# Patient Record
Sex: Female | Born: 1985 | Race: White | Hispanic: No | Marital: Married | Smoking: Never smoker
Health system: Southern US, Community
[De-identification: ages and names within clinical notes are randomized; demographics above are authoritative.]

---

## 2016-12-08 ENCOUNTER — Ambulatory Visit (INDEPENDENT_AMBULATORY_CARE_PROVIDER_SITE_OTHER): Payer: Managed Care, Other (non HMO)

## 2016-12-08 ENCOUNTER — Ambulatory Visit
Admission: EM | Admit: 2016-12-08 | Discharge: 2016-12-08 | Disposition: A | Discharge status: Home / Self Care | Payer: Managed Care, Other (non HMO) | Attending: Family Medicine | Admitting: Family Medicine

## 2016-12-08 DIAGNOSIS — S93492A Sprain of other ligament of left ankle, initial encounter: Secondary | ICD-10-CM

## 2016-12-08 MED ORDER — NAPROXEN 500 MG PO TABS: 500.0000 mg | ORAL_TABLET | Freq: Two times a day (BID) | ORAL | 0 refills | Status: AC

## 2016-12-08 NOTE — ED Provider Notes (Signed)
CSN: [id number]     Arrival date & time 12/08/16  1108 History   First MD Initiated Contact with Patient 12/08/16 1149     Chief Complaint  Patient presents with  . Ankle Pain    left   (Consider location/radiation/quality/duration/timing/severity/associated sxs/prior Treatment) HPI  31 year old female who presents with swelling of her left lateral ankle. No trauma that she remembers is not create increase her activities although they are visiting here from [place]. She states that she does have a mild limp with ambulation. He has no pain distally        History reviewed. No pertinent past medical history. History reviewed. No pertinent surgical history. Family History  Problem Relation Age of Onset  . Hypertension Mother   . Thyroid disease Mother   . Diabetes Father    Social History  Substance Use Topics  . Smoking status: Never Smoker  . Smokeless tobacco: Never Used  . Alcohol use Not on file   OB History    No data available     Review of Systems  Constitutional: Positive for activity change. Negative for chills, fatigue and fever.  Musculoskeletal: Positive for joint swelling.  All other systems reviewed and are negative.   Allergies  Patient has no known allergies.  Home Medications   Prior to Admission medications   Medication Sig Start Date End Date Taking? Authorizing Provider  naproxen (NAPROSYN) 500 MG tablet Take 1 tablet (500 mg total) by mouth 2 (two) times daily with a meal. 12/08/16   Lutricia Feil, PA-C   Meds Ordered and Administered this Visit  Medications - No data to display  BP 119/74 (BP Location: Left Arm)   Pulse 76   Temp 98 F (36.7 C) (Oral)   Resp 18   Ht 5\' 8"  (1.727 m)   Wt 167 lb (75.8 kg)   LMP 12/02/2016   SpO2 100%   BMI 25.39 kg/m  No data found.   Physical Exam  Constitutional: She is oriented to person, place, and time. She appears well-developed and well-nourished. No distress.  HENT:  Head:  Normocephalic.  Eyes: Pupils are equal, round, and reactive to light.  Neck: Normal range of motion.  Musculoskeletal: Normal range of motion. She exhibits edema and tenderness.  Examination of the left ankle shows swelling inferior and slightly medial to the fibula.  Tenderness is sharply localized over the anterior O medial distal fibular tip and over the anterior fibulotalar ligament. There is bogginess present. There is no crepitus no induration. There are no breaks of the skin. There is no erythema or warmth present.  Neurological: She is alert and oriented to person, place, and time.  Skin: Skin is warm and dry. She is not diaphoretic.  Psychiatric: She has a normal mood and affect. Her behavior is normal. Judgment and thought content normal.  Nursing note and vitals reviewed.   Urgent Care Course     Procedures (including critical care time)  Labs Review Labs Reviewed - No data to display  Imaging Review Dg Ankle Complete Left  Result Date: 12/08/2016 CLINICAL DATA:  Patient with left ankle swelling. No traumatic injury. Initial encounter. EXAM: LEFT ANKLE COMPLETE - 3+ VIEW COMPARISON:  None. FINDINGS: Mild soft tissue swelling about the lateral malleolus. Normal anatomic alignment. No evidence for acute fracture or dislocation. Talar dome intact. IMPRESSION: No acute osseous abnormality. Soft tissue swelling about the lateral malleoli/ankle. Electronically Signed   By: Annia Belt M.D.   On: 12/08/2016 12:17  Visual Acuity Review  Right Eye Distance:   Left Eye Distance:   Bilateral Distance:    Right Eye Near:   Left Eye Near:    Bilateral Near:         MDM   1. Sprain of anterior talofibular ligament of left ankle, initial encounter    New Prescriptions   NAPROXEN (NAPROSYN) 500 MG TABLET    Take 1 tablet (500 mg total) by mouth 2 (two) times daily with a meal.  Plan: 1. Test/x-ray results and diagnosis reviewed with patient 2. rx as per orders; risks,  benefits, potential side effects reviewed with patient 3. Recommend supportive treatment with Symptom avoidance and rest. Use ankle stirrup brace as needed for comfort. Ice 20 minutes out of every 2 hours 4 times daily as well as elevation above the heart 3 times daily for 30 minutes at a time to help control swelling. Advance activities as tolerated. If not improving [place].   Lutricia FeilRoemer, Corrina Steffensen P, PA-C 12/08/16 1237

## 2016-12-08 NOTE — ED Triage Notes (Addendum)
Pt presents with a swollen left ankle. She denies any trauma and says she noticed it about 2 days ago. Not painful to walk on it.

## 2018-05-13 IMAGING — CR DG ANKLE COMPLETE 3+V*L*
3 series · 3 of 3 positions shown · non-contrast
Comparison: None.

CLINICAL DATA: Patient with left ankle swelling. No traumatic
injury. Initial encounter.

EXAM:
LEFT ANKLE COMPLETE - 3+ VIEW

[ankle ap]
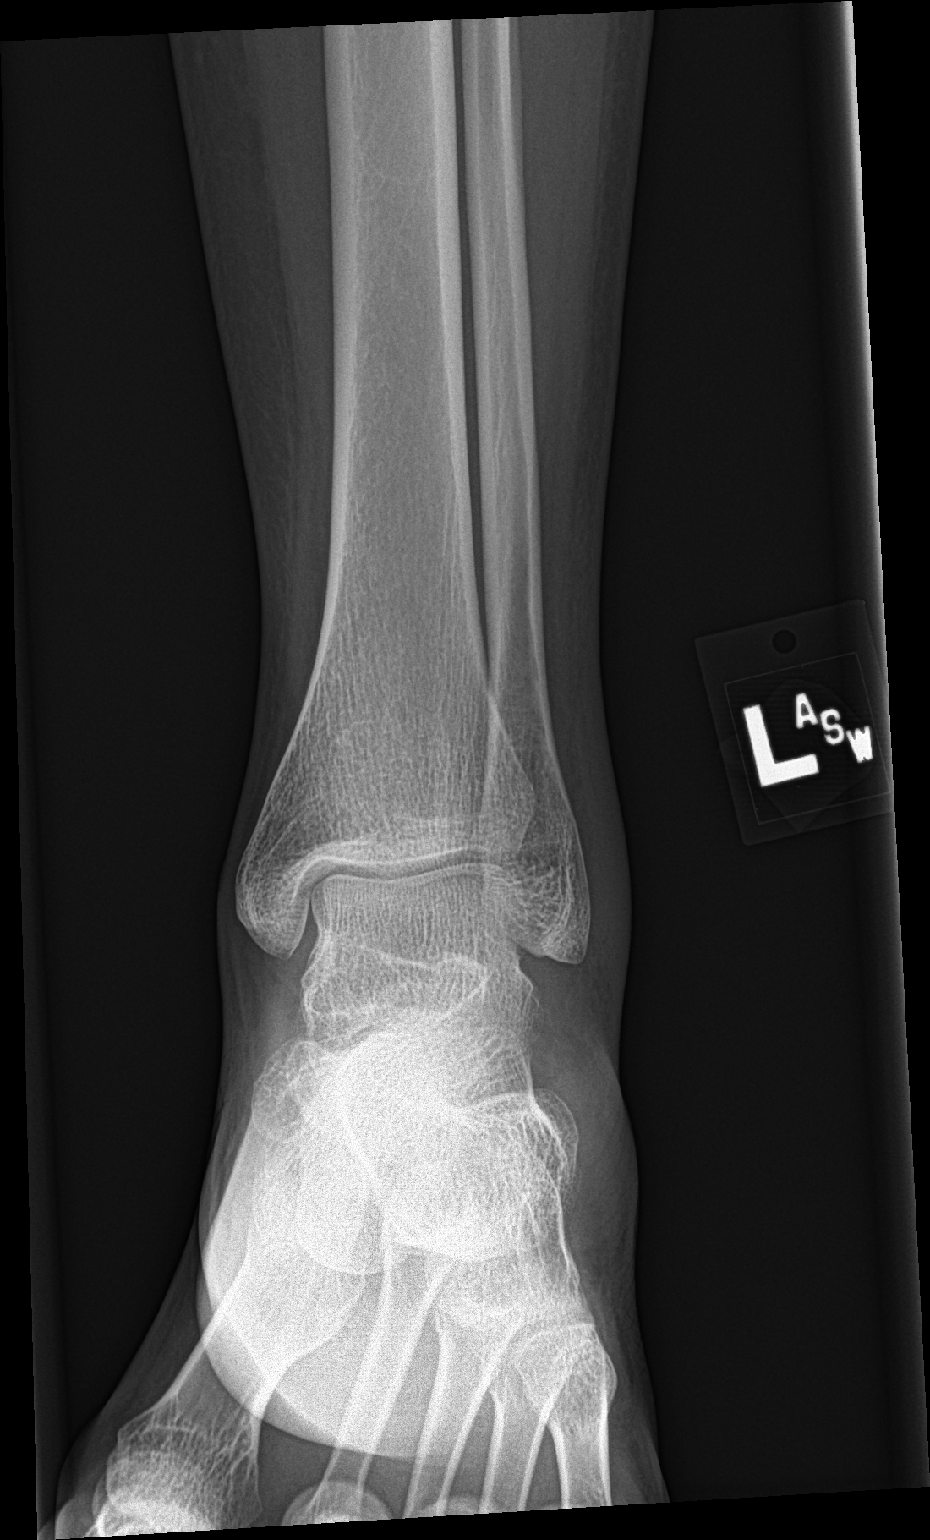

[ankle obl]
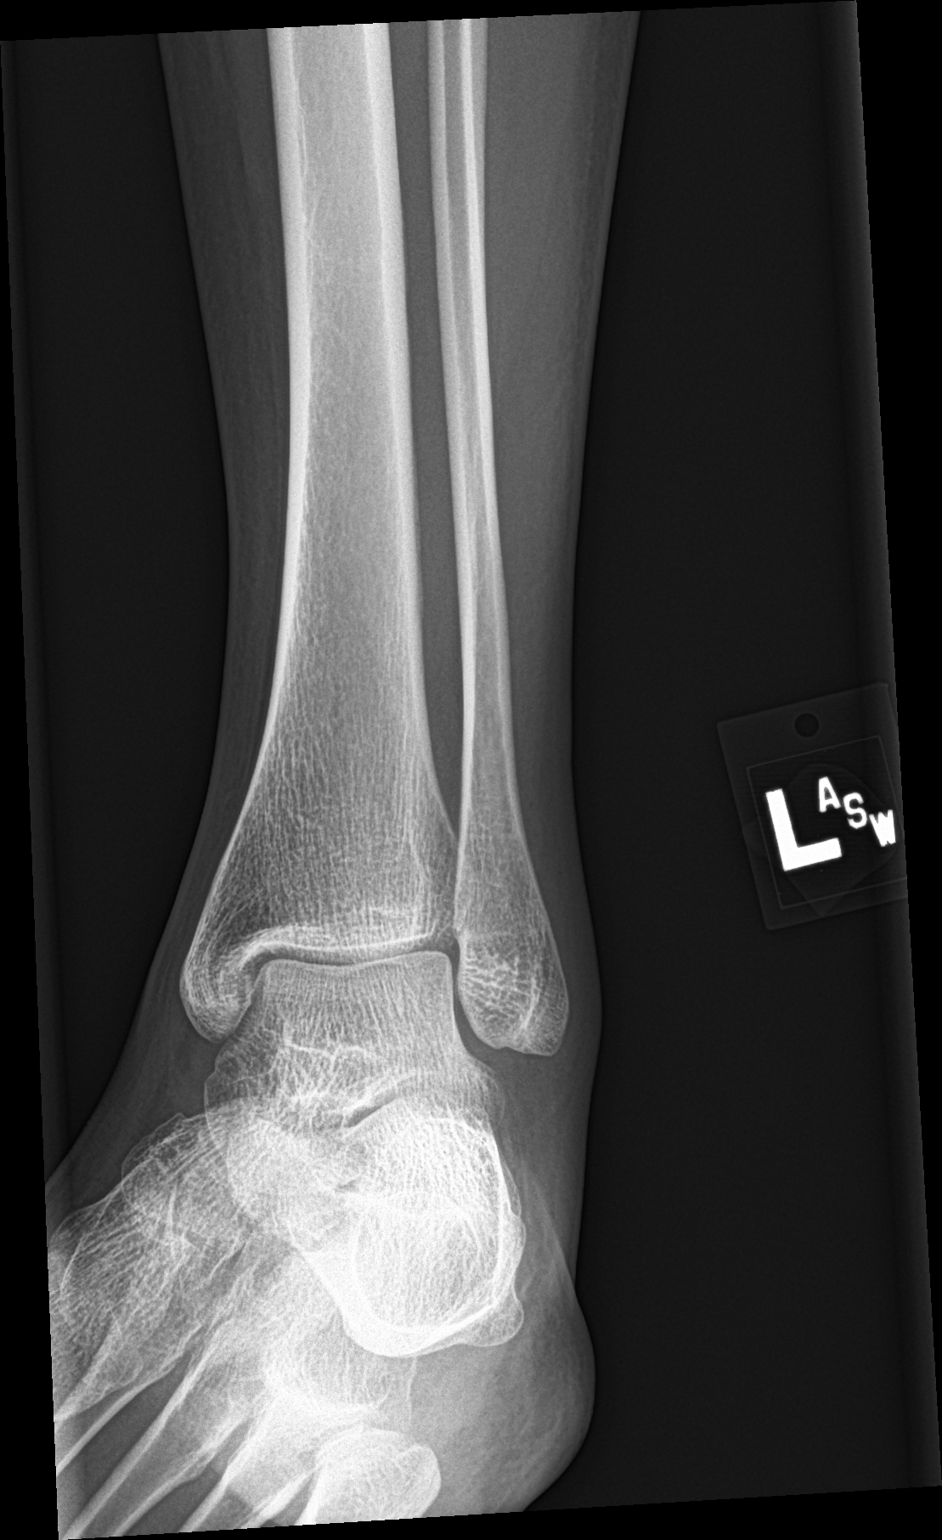

[ankle lat]
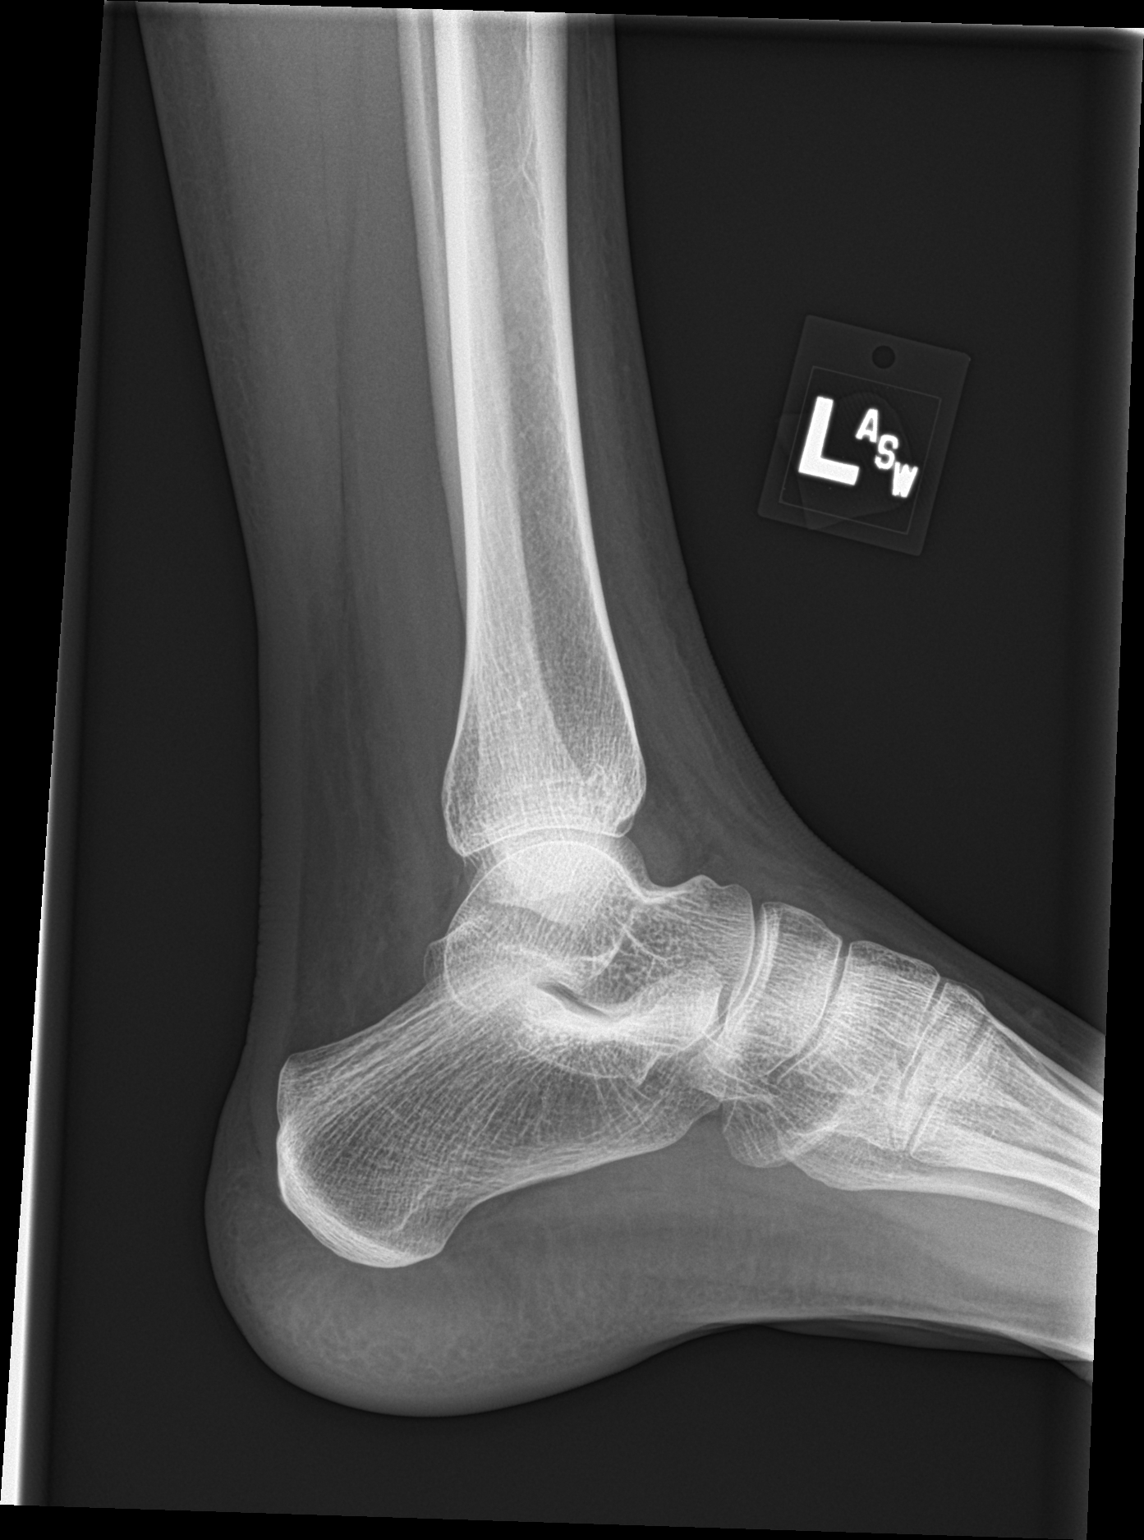

[3 of 3 positions shown; findings below may reference images not displayed]

FINDINGS: Mild soft tissue swelling about the lateral malleolus. Normal
anatomic alignment. No evidence for acute fracture or dislocation.
Talar dome intact.
IMPRESSION: No acute osseous abnormality.

Soft tissue swelling about the lateral malleoli/ankle.
